# Patient Record
Sex: Female | Born: 1955
Health system: Southern US, Community
[De-identification: ages and names within clinical notes are randomized; demographics above are authoritative.]

## PROBLEM LIST (undated history)

## (undated) DIAGNOSIS — I10 Essential (primary) hypertension: Secondary | ICD-10-CM

## (undated) DIAGNOSIS — J45909 Unspecified asthma, uncomplicated: Secondary | ICD-10-CM

## (undated) DIAGNOSIS — E7421 Galactosemia: Secondary | ICD-10-CM

## (undated) HISTORY — DX: Galactosemia: E74.21

## (undated) HISTORY — PX: TONSILLECTOMY AND ADENOIDECTOMY: SUR1326

## (undated) HISTORY — DX: Unspecified asthma, uncomplicated: J45.909

## (undated) HISTORY — DX: Essential (primary) hypertension: I10

---

## 1998-07-03 ENCOUNTER — Other Ambulatory Visit: Admission: RE | Admit: 1998-07-03 | Discharge: 1998-07-03 | Payer: Self-pay | Admitting: Obstetrics and Gynecology

## 1998-08-03 ENCOUNTER — Ambulatory Visit (HOSPITAL_COMMUNITY): Admission: RE | Admit: 1998-08-03 | Discharge: 1998-08-03 | Payer: Self-pay | Admitting: Obstetrics and Gynecology

## 1998-08-15 ENCOUNTER — Encounter: Payer: Self-pay | Admitting: Obstetrics and Gynecology

## 1998-08-15 ENCOUNTER — Ambulatory Visit (HOSPITAL_COMMUNITY): Admission: RE | Admit: 1998-08-15 | Discharge: 1998-08-15 | Payer: Self-pay | Admitting: Obstetrics and Gynecology

## 1998-08-31 ENCOUNTER — Ambulatory Visit (HOSPITAL_COMMUNITY): Admission: RE | Admit: 1998-08-31 | Discharge: 1998-08-31 | Payer: Self-pay | Admitting: Obstetrics and Gynecology

## 1998-08-31 ENCOUNTER — Encounter: Payer: Self-pay | Admitting: Obstetrics and Gynecology

## 1999-01-11 ENCOUNTER — Ambulatory Visit (HOSPITAL_COMMUNITY): Admission: RE | Admit: 1999-01-11 | Discharge: 1999-01-11 | Payer: Self-pay | Admitting: Obstetrics and Gynecology

## 1999-01-29 ENCOUNTER — Encounter (INDEPENDENT_AMBULATORY_CARE_PROVIDER_SITE_OTHER): Payer: Self-pay | Admitting: Specialist

## 1999-01-29 ENCOUNTER — Other Ambulatory Visit: Admission: RE | Admit: 1999-01-29 | Discharge: 1999-01-29 | Payer: Self-pay | Admitting: Obstetrics and Gynecology

## 1999-02-23 HISTORY — PX: TOTAL VAGINAL HYSTERECTOMY: SHX2548

## 1999-02-27 ENCOUNTER — Encounter (INDEPENDENT_AMBULATORY_CARE_PROVIDER_SITE_OTHER): Payer: Self-pay

## 1999-02-27 ENCOUNTER — Inpatient Hospital Stay (HOSPITAL_COMMUNITY): Admission: RE | Admit: 1999-02-27 | Discharge: 1999-03-01 | Payer: Self-pay | Admitting: Obstetrics and Gynecology

## 1999-09-19 ENCOUNTER — Ambulatory Visit (HOSPITAL_COMMUNITY): Admission: RE | Admit: 1999-09-19 | Discharge: 1999-09-19 | Payer: Self-pay | Admitting: Obstetrics and Gynecology

## 1999-09-19 ENCOUNTER — Encounter: Payer: Self-pay | Admitting: Obstetrics and Gynecology

## 2000-09-22 ENCOUNTER — Ambulatory Visit (HOSPITAL_COMMUNITY): Admission: RE | Admit: 2000-09-22 | Discharge: 2000-09-22 | Payer: Self-pay | Admitting: Obstetrics and Gynecology

## 2000-09-22 ENCOUNTER — Encounter: Payer: Self-pay | Admitting: Obstetrics and Gynecology

## 2001-02-22 HISTORY — PX: LAPAROSCOPIC CHOLECYSTECTOMY: SUR755

## 2001-10-15 ENCOUNTER — Encounter: Payer: Self-pay | Admitting: Obstetrics and Gynecology

## 2001-10-15 ENCOUNTER — Ambulatory Visit (HOSPITAL_COMMUNITY): Admission: RE | Admit: 2001-10-15 | Discharge: 2001-10-15 | Payer: Self-pay | Admitting: Obstetrics and Gynecology

## 2003-02-07 ENCOUNTER — Encounter: Payer: Self-pay | Admitting: Obstetrics and Gynecology

## 2003-02-07 ENCOUNTER — Ambulatory Visit (HOSPITAL_COMMUNITY): Admission: RE | Admit: 2003-02-07 | Discharge: 2003-02-07 | Payer: Self-pay | Admitting: Obstetrics and Gynecology

## 2004-04-30 ENCOUNTER — Ambulatory Visit (HOSPITAL_COMMUNITY): Admission: RE | Admit: 2004-04-30 | Discharge: 2004-04-30 | Payer: Self-pay | Admitting: Obstetrics and Gynecology

## 2005-08-12 ENCOUNTER — Ambulatory Visit (HOSPITAL_COMMUNITY): Admission: RE | Admit: 2005-08-12 | Discharge: 2005-08-12 | Payer: Self-pay | Admitting: Obstetrics and Gynecology

## 2006-09-30 ENCOUNTER — Ambulatory Visit (HOSPITAL_COMMUNITY): Admission: RE | Admit: 2006-09-30 | Discharge: 2006-09-30 | Payer: Self-pay | Admitting: Obstetrics & Gynecology

## 2007-10-02 ENCOUNTER — Ambulatory Visit (HOSPITAL_COMMUNITY): Admission: RE | Admit: 2007-10-02 | Discharge: 2007-10-02 | Payer: Self-pay | Admitting: Obstetrics & Gynecology

## 2009-12-18 ENCOUNTER — Ambulatory Visit: Payer: Self-pay | Admitting: Cardiology

## 2010-01-19 ENCOUNTER — Ambulatory Visit (HOSPITAL_COMMUNITY): Admission: RE | Admit: 2010-01-19 | Discharge: 2010-01-19 | Payer: Self-pay | Admitting: Obstetrics & Gynecology

## 2010-07-15 ENCOUNTER — Encounter: Payer: Self-pay | Admitting: Obstetrics & Gynecology

## 2011-07-15 ENCOUNTER — Other Ambulatory Visit: Payer: Self-pay | Admitting: Obstetrics & Gynecology

## 2011-07-15 DIAGNOSIS — Z1231 Encounter for screening mammogram for malignant neoplasm of breast: Secondary | ICD-10-CM

## 2011-07-16 ENCOUNTER — Ambulatory Visit (HOSPITAL_COMMUNITY)
Admission: RE | Admit: 2011-07-16 | Discharge: 2011-07-16 | Disposition: A | Discharge status: Home / Self Care | Payer: BC Managed Care – PPO | Source: Ambulatory Visit | Attending: Obstetrics & Gynecology | Admitting: Obstetrics & Gynecology

## 2011-07-16 DIAGNOSIS — Z1231 Encounter for screening mammogram for malignant neoplasm of breast: Secondary | ICD-10-CM | POA: Insufficient documentation

## 2012-09-03 ENCOUNTER — Other Ambulatory Visit: Payer: Self-pay | Admitting: Obstetrics & Gynecology

## 2012-09-03 DIAGNOSIS — Z1231 Encounter for screening mammogram for malignant neoplasm of breast: Secondary | ICD-10-CM

## 2012-09-09 ENCOUNTER — Ambulatory Visit (HOSPITAL_COMMUNITY): Payer: BC Managed Care – PPO

## 2012-09-23 ENCOUNTER — Ambulatory Visit (HOSPITAL_COMMUNITY): Payer: BC Managed Care – PPO

## 2013-10-05 ENCOUNTER — Encounter: Payer: Self-pay | Admitting: Obstetrics & Gynecology

## 2013-10-07 ENCOUNTER — Encounter: Payer: Self-pay | Admitting: Obstetrics & Gynecology

## 2013-10-07 ENCOUNTER — Ambulatory Visit (INDEPENDENT_AMBULATORY_CARE_PROVIDER_SITE_OTHER): Payer: BC Managed Care – PPO | Admitting: Obstetrics & Gynecology

## 2013-10-07 VITALS — BP 112/84 | HR 68 | Resp 20 | Ht 64.0 in | Wt 239.0 lb

## 2013-10-07 DIAGNOSIS — Z1239 Encounter for other screening for malignant neoplasm of breast: Secondary | ICD-10-CM

## 2013-10-07 DIAGNOSIS — Z0000 Encounter for general adult medical examination without abnormal findings: Secondary | ICD-10-CM

## 2013-10-07 DIAGNOSIS — N390 Urinary tract infection, site not specified: Secondary | ICD-10-CM

## 2013-10-07 DIAGNOSIS — Z01419 Encounter for gynecological examination (general) (routine) without abnormal findings: Secondary | ICD-10-CM

## 2013-10-07 LAB — POCT URINALYSIS DIPSTICK
BILIRUBIN UA: NEGATIVE
Glucose, UA: NEGATIVE
Ketones, UA: NEGATIVE
Leukocytes, UA: NEGATIVE
Nitrite, UA: POSITIVE
Protein, UA: NEGATIVE
RBC UA: NEGATIVE
UROBILINOGEN UA: NEGATIVE
pH, UA: 5

## 2013-10-07 MED ORDER — SULFAMETHOXAZOLE-TMP DS 800-160 MG PO TABS: 1.0000 | ORAL_TABLET | Freq: Two times a day (BID) | ORAL | Status: DC

## 2013-10-07 NOTE — Progress Notes (Addendum)
58 y.o. [id number] MarriedCaucasianF here for annual exam.  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  The holiday was really busy for her called "[employer]  She has been really busy and is so excited for this.  No vaginal bleeding.  Declines bone density.  Declines medication so refuses BMD.  Denies any urinary symptoms today.  Aware urine shows nitrites.    Seeing Dr. Jonelle SportsPomposini next week.  Pulmonologist pushed her out to a year at last visit.  She has done really well over the last few years.   No LMP recorded. Patient has had a hysterectomy.          Sexually active: yes  The current method of family planning is status post hysterectomy.    Exercising: yes  Walk Smoker:  no  Health Maintenance: Pap:  2000  History of abnormal Pap:  no MMG:  06/2011 BI-RADS 1: Neg Colonoscopy:  2013 - Every 5 years BMD:   None TDaP:  2010 Screening Labs: PCP, Hb today: PCP, Urine today: Nitrates +. All else Neg.   reports that she has never smoked. She has never used smokeless tobacco. She reports that she does not drink alcohol or use illicit drugs.  Past Medical History  Diagnosis Date  . Galactosemia     Carrier  . Mild asthma   . Hypertension     Past Surgical History  Procedure Laterality Date  . Tonsillectomy and adenoidectomy  Age 58  . Total vaginal hysterectomy  02/1999  . Laparoscopic cholecystectomy  02/2001  . Cesarean section  1988    Current Outpatient Prescriptions  Medication Sig Dispense Refill  . ASMANEX 30 METERED DOSES 220 MCG/INH inhaler Inhale 1 puff into the lungs daily.       Marland Kitchen. diltiazem (CARDIZEM LA) 240 MG 24 hr tablet Take 240 mg by mouth daily.      . fexofenadine (ALLEGRA) 30 MG tablet Take 30 mg by mouth daily.      Marland Kitchen. FLUOCINOLONE ACETONIDE BODY 0.01 % OIL daily.       . fluticasone (VERAMYST) 27.5 MCG/SPRAY nasal spray Place 2 sprays into the nose as needed for rhinitis.      . hydrochlorothiazide (MICROZIDE) 12.5 MG capsule Take 12.5 mg by mouth  daily.      . Ibuprofen (ADVIL) 200 MG CAPS Take by mouth as needed.      . montelukast (SINGULAIR) 10 MG tablet Take 10 mg by mouth at bedtime.      . niacin (NIASPAN) 500 MG CR tablet Take 500 mg by mouth at bedtime.      . niacin-lovastatin (ADVICOR) 500-20 MG 24 hr tablet Take 1 tablet by mouth at bedtime.      . potassium chloride SA (K-DUR,KLOR-CON) 20 MEQ tablet Take 20 mEq by mouth 2 (two) times daily.      Marland Kitchen. EPINEPHrine HCl (ASTHMANEFRIN IN) Inhale into the lungs.      Marland Kitchen. ipratropium-albuterol (DUONEB) 0.5-2.5 (3) MG/3ML SOLN Take 3 mLs by nebulization as needed.       No current facility-administered medications for this visit.    Family History  Problem Relation Age of Onset  . Diabetes Father   . Hypertension Father   . Cancer Father     Prostate  . Diabetes Mellitus II Maternal Grandmother   . Liver cancer Paternal Grandfather   . Hypertension Mother   . Heart disease Mother   . Heart disease Maternal Uncle   . Stroke Paternal Grandmother   .  Hypertension Paternal Grandmother     ROS:  Pertinent items are noted in HPI.  Otherwise, a comprehensive ROS was negative.  Exam:   BP 112/84  Pulse 68  Resp 20  Ht 5\' 4"  (1.626 m)  Wt 239 lb (108.41 kg)  BMI 41.00 kg/m2  Weight change: +17#  Height: 5\' 4"  (162.6 cm)  Ht Readings from Last 3 Encounters:  10/07/13 5\' 4"  (1.626 m)    General appearance: alert, cooperative and appears stated age Head: Normocephalic, without obvious abnormality, atraumatic Neck: no adenopathy, supple, symmetrical, trachea midline and thyroid normal to inspection and palpation Lungs: clear to auscultation bilaterally Breasts: normal appearance, no masses or tenderness Heart: regular rate and rhythm Abdomen: soft, non-tender; bowel sounds normal; no masses,  no organomegaly Extremities: extremities normal, atraumatic, no cyanosis or edema Skin: Skin color, texture, turgor normal. No rashes or lesions Lymph nodes: Cervical, supraclavicular,  and axillary nodes normal. No abnormal inguinal nodes palpated Neurologic: Grossly normal   Pelvic: External genitalia:  no lesions              Urethra:  normal appearing urethra with no masses, tenderness or lesions              Bartholins and Skenes: normal                 Vagina: normal appearing vagina with normal color and discharge, no lesions              Cervix: absent              Pap taken: no Bimanual Exam:  Uterus:  uterus absent              Adnexa: no mass, fullness, tenderness               Rectovaginal: Confirms               Anus:  normal sphincter tone, no lesions  A:  Well Woman with normal exam PMP, no HRT Entereocele Family hx of colon cancer.  Has colonoscopy every 5 years. Asthma Nitrate + urine  P:   Mammogram overdue.  Pt aware.   Will scheduled.  Order placed. pap smear not indicated. Bactrim ds bid x 5 days to use if symptoms occur. Urine culture pending. Labs with PCP. return annually or prn  An After Visit Summary was printed and given to the patient.

## 2013-10-07 NOTE — Patient Instructions (Signed)

## 2013-10-09 LAB — URINE CULTURE
COLONY COUNT: NO GROWTH
ORGANISM ID, BACTERIA: NO GROWTH

## 2014-04-25 ENCOUNTER — Encounter: Payer: Self-pay | Admitting: Obstetrics & Gynecology

## 2014-10-21 ENCOUNTER — Ambulatory Visit: Payer: BC Managed Care – PPO | Admitting: Obstetrics & Gynecology

## 2014-12-20 ENCOUNTER — Encounter: Payer: Self-pay | Admitting: Obstetrics & Gynecology

## 2014-12-20 ENCOUNTER — Ambulatory Visit (INDEPENDENT_AMBULATORY_CARE_PROVIDER_SITE_OTHER): Payer: BLUE CROSS/BLUE SHIELD | Admitting: Obstetrics & Gynecology

## 2014-12-20 VITALS — BP 138/94 | HR 80 | Resp 18 | Ht 63.75 in | Wt 231.0 lb

## 2014-12-20 DIAGNOSIS — Z01419 Encounter for gynecological examination (general) (routine) without abnormal findings: Secondary | ICD-10-CM

## 2014-12-20 DIAGNOSIS — K469 Unspecified abdominal hernia without obstruction or gangrene: Secondary | ICD-10-CM | POA: Diagnosis not present

## 2014-12-20 NOTE — Progress Notes (Signed)
59 y.o. [id number] MarriedCaucasianF here for annual exam.  Doing well.  Denis vaginal bleeding.  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  Will be totally in the black this year.    PCP:  Dr. Jonelle SportsPomposini in [place].  Last visit was in March/April.  Goes twice yearly.  Labs were good per pt.    No LMP recorded. Patient has had a hysterectomy.          Sexually active: Yes.    The current method of family planning is status post hysterectomy.    Exercising: Yes.    Walking every day Smoker:  no  Health Maintenance: Pap:  2000 - Normal  History of abnormal Pap:  no MMG:  07/17/11 BIRADS1:neg Colonoscopy:  2013 - every 5 years BMD:   None TDaP:  2010 Screening Labs: PCP, Hb today: PCP, Urine today: PCP   reports that she has never smoked. She has never used smokeless tobacco. She reports that she does not drink alcohol or use illicit drugs.  Past Medical History  Diagnosis Date  . Galactosemia     Carrier  . Mild asthma   . Hypertension     Past Surgical History  Procedure Laterality Date  . Tonsillectomy and adenoidectomy  Age 59  . Total vaginal hysterectomy  02/1999    A+P repair, cystoscopy  . Laparoscopic cholecystectomy  02/2001  . Cesarean section  1988    Current Outpatient Prescriptions  Medication Sig Dispense Refill  . ASMANEX 30 METERED DOSES 220 MCG/INH inhaler Inhale 1 puff into the lungs daily.     . Cholecalciferol (VITAMIN D) 2000 UNITS tablet Take 2,000 Units by mouth daily.    Marland Kitchen. diltiazem (CARDIZEM LA) 240 MG 24 hr tablet Take 240 mg by mouth daily.    . fexofenadine (ALLEGRA) 30 MG tablet Take 30 mg by mouth daily.    . montelukast (SINGULAIR) 10 MG tablet Take 10 mg by mouth at bedtime.    . niacin-lovastatin (ADVICOR) 500-20 MG 24 hr tablet Take 1 tablet by mouth at bedtime.    Marland Kitchen. EPINEPHrine HCl (ASTHMANEFRIN IN) Inhale into the lungs.     No current facility-administered medications for this  visit.    Family History  Problem Relation Age of Onset  . Diabetes Father   . Hypertension Father   . Cancer Father     Prostate  . Diabetes Mellitus II Maternal Grandmother   . Liver cancer Paternal Grandfather   . Hypertension Mother   . Heart disease Mother   . Heart disease Maternal Uncle   . Stroke Paternal Grandmother   . Hypertension Paternal Grandmother     ROS:  Pertinent items are noted in HPI.  Otherwise, a comprehensive ROS was negative.  Exam:   BP 138/94 mmHg  Pulse 80  Resp 18  Ht 5' 3.75" (1.619 m)  Wt 231 lb (104.781 kg)  BMI 39.98 kg/m2  Weight change: -8 pounds  Height: 5' 3.75" (161.9 cm)  Ht Readings from Last 3 Encounters:  12/20/14 5' 3.75" (1.619 m)  10/07/13 5\' 4"  (1.626 m)    General appearance: alert, cooperative and appears stated age Head: Normocephalic, without obvious abnormality, atraumatic Neck: no adenopathy, supple, symmetrical, trachea midline and thyroid normal to inspection and palpation Lungs: clear to auscultation bilaterally Breasts: normal appearance, no masses or tenderness Heart: regular rate and rhythm Abdomen: soft, non-tender; bowel sounds normal; no masses,  no organomegaly Extremities:  extremities normal, atraumatic, no cyanosis or edema Skin: Skin color, texture, turgor normal. No rashes or lesions Lymph nodes: Cervical, supraclavicular, and axillary nodes normal. No abnormal inguinal nodes palpated Neurologic: Grossly normal   Pelvic: External genitalia:  no lesions              Urethra:  normal appearing urethra with no masses, tenderness or lesions              Bartholins and Skenes: normal                 Vagina: normal appearing vagina with normal color and discharge, no lesions              Cervix: absent              Pap taken: No. Bimanual Exam:  Uterus:  uterus absent              Adnexa: no mass, fullness, tenderness               Rectovaginal: Confirms               Anus:  normal sphincter tone, no  lesions  Chaperone was present for exam.  A:  Well Woman with normal exam PMP, no HRT Entereocele Family hx of colon cancer. Has colonoscopy every 5 years.  Next due 2018. Asthma  P: Mammogram overdue. Pt aware. Scheduled for her today while she was in the office.  Scheduled for July 1st at 4pm for her.   pap smear not indicated. Labs with PCP return annually or prn

## 2014-12-23 ENCOUNTER — Ambulatory Visit
Admission: RE | Admit: 2014-12-23 | Discharge: 2014-12-23 | Disposition: A | Discharge status: Home / Self Care | Payer: BLUE CROSS/BLUE SHIELD | Source: Ambulatory Visit | Attending: Obstetrics & Gynecology | Admitting: Obstetrics & Gynecology

## 2014-12-23 DIAGNOSIS — Z1239 Encounter for other screening for malignant neoplasm of breast: Secondary | ICD-10-CM

## 2014-12-28 ENCOUNTER — Ambulatory Visit: Payer: Self-pay | Admitting: Obstetrics & Gynecology

## 2015-12-01 ENCOUNTER — Other Ambulatory Visit: Payer: Self-pay | Admitting: Orthopedic Surgery

## 2015-12-01 DIAGNOSIS — R52 Pain, unspecified: Secondary | ICD-10-CM

## 2015-12-01 DIAGNOSIS — R609 Edema, unspecified: Secondary | ICD-10-CM

## 2015-12-11 ENCOUNTER — Other Ambulatory Visit: Payer: Self-pay | Admitting: Orthopedic Surgery

## 2015-12-11 ENCOUNTER — Ambulatory Visit
Admission: RE | Admit: 2015-12-11 | Discharge: 2015-12-11 | Disposition: A | Discharge status: Home / Self Care | Payer: BLUE CROSS/BLUE SHIELD | Source: Ambulatory Visit | Attending: Orthopedic Surgery | Admitting: Orthopedic Surgery

## 2015-12-11 DIAGNOSIS — Z77018 Contact with and (suspected) exposure to other hazardous metals: Secondary | ICD-10-CM

## 2015-12-11 DIAGNOSIS — R609 Edema, unspecified: Secondary | ICD-10-CM

## 2015-12-11 DIAGNOSIS — R52 Pain, unspecified: Secondary | ICD-10-CM

## 2015-12-14 DIAGNOSIS — M25562 Pain in left knee: Secondary | ICD-10-CM

## 2015-12-14 DIAGNOSIS — G8929 Other chronic pain: Secondary | ICD-10-CM | POA: Insufficient documentation

## 2016-01-03 HISTORY — PX: KNEE ARTHROSCOPY: SUR90

## 2016-01-09 DIAGNOSIS — Z9889 Other specified postprocedural states: Secondary | ICD-10-CM | POA: Insufficient documentation

## 2016-02-05 ENCOUNTER — Other Ambulatory Visit: Payer: Self-pay | Admitting: Obstetrics & Gynecology

## 2016-02-05 DIAGNOSIS — Z1231 Encounter for screening mammogram for malignant neoplasm of breast: Secondary | ICD-10-CM

## 2016-02-13 ENCOUNTER — Ambulatory Visit
Admission: RE | Admit: 2016-02-13 | Discharge: 2016-02-13 | Disposition: A | Discharge status: Home / Self Care | Payer: BLUE CROSS/BLUE SHIELD | Source: Ambulatory Visit | Attending: Obstetrics & Gynecology | Admitting: Obstetrics & Gynecology

## 2016-02-13 DIAGNOSIS — Z1231 Encounter for screening mammogram for malignant neoplasm of breast: Secondary | ICD-10-CM

## 2016-03-19 ENCOUNTER — Encounter: Payer: Self-pay | Admitting: Obstetrics & Gynecology

## 2016-03-19 ENCOUNTER — Ambulatory Visit (INDEPENDENT_AMBULATORY_CARE_PROVIDER_SITE_OTHER): Payer: BLUE CROSS/BLUE SHIELD | Admitting: Obstetrics & Gynecology

## 2016-03-19 VITALS — BP 132/88 | HR 82 | Resp 14 | Ht 64.0 in | Wt 216.6 lb

## 2016-03-19 DIAGNOSIS — K469 Unspecified abdominal hernia without obstruction or gangrene: Secondary | ICD-10-CM

## 2016-03-19 DIAGNOSIS — Z0000 Encounter for general adult medical examination without abnormal findings: Secondary | ICD-10-CM

## 2016-03-19 DIAGNOSIS — R8290 Unspecified abnormal findings in urine: Secondary | ICD-10-CM

## 2016-03-19 DIAGNOSIS — Z01419 Encounter for gynecological examination (general) (routine) without abnormal findings: Secondary | ICD-10-CM

## 2016-03-19 DIAGNOSIS — N3090 Cystitis, unspecified without hematuria: Secondary | ICD-10-CM

## 2016-03-19 LAB — POCT URINALYSIS DIPSTICK
BILIRUBIN UA: NEGATIVE
GLUCOSE UA: NEGATIVE
Ketones, UA: NEGATIVE
LEUKOCYTES UA: NEGATIVE
NITRITE UA: POSITIVE
Protein, UA: NEGATIVE
RBC UA: NEGATIVE
UROBILINOGEN UA: NEGATIVE
pH, UA: 5

## 2016-03-19 NOTE — Progress Notes (Addendum)
60 y.o. [id number] MarriedCaucasianF here for annual exam.  No vaginal bleeding.    PCP:  Dr. Jonelle Sports.  She is going to see [facility] for new pt appt in October.  Transferring care to [facility].  Lives in [place], [place].  No LMP recorded. Patient has had a hysterectomy.          Sexually active: Yes.    The current method of family planning is status post hysterectomy.    Exercising: Yes.    walking Smoker:  no  Health Maintenance: Pap:  2000 normal  History of abnormal Pap:  no MMG:  02/13/16 BIRADS 1 negative  Colonoscopy:  2013 repeat 5 years  BMD:   Never, not sure she would not have any findings treated TDaP:  2010  Pneumonia vaccine(s):  2012  Zostavax:   never Hep C testing: will do with PCP Screening Labs: PCP, Hb today: PCP, Urine today: positive nitrites   reports that she has never smoked. She has never used smokeless tobacco. She reports that she does not drink alcohol or use drugs.  Past Medical History:  Diagnosis Date  . Galactosemia    Carrier  . Hypertension   . Mild asthma     Past Surgical History:  Procedure Laterality Date  . CESAREAN SECTION  1988  . LAPAROSCOPIC CHOLECYSTECTOMY  02/2001  . TONSILLECTOMY AND ADENOIDECTOMY  Age 51  . TOTAL VAGINAL HYSTERECTOMY  02/1999   A+P repair, cystoscopy    Current Outpatient Prescriptions  Medication Sig Dispense Refill  . ASMANEX 30 METERED DOSES 220 MCG/INH inhaler Inhale 1 puff into the lungs daily.     . Cholecalciferol (VITAMIN D) 2000 UNITS tablet Take 2,000 Units by mouth daily.    Marland Kitchen diltiazem (CARDIZEM LA) 240 MG 24 hr tablet Take 240 mg by mouth daily.    Marland Kitchen EPINEPHrine HCl (ASTHMANEFRIN IN) Inhale into the lungs.    . fexofenadine (ALLEGRA) 30 MG tablet Take 30 mg by mouth daily.    . montelukast (SINGULAIR) 10 MG tablet Take 10 mg by mouth at bedtime.    . niacin-lovastatin (ADVICOR) 500-20 MG 24 hr tablet Take 1 tablet by mouth at bedtime.     No current facility-administered medications for  this visit.     Family History  Problem Relation Age of Onset  . Diabetes Father   . Hypertension Father   . Cancer Father     Prostate  . Diabetes Mellitus II Maternal Grandmother   . Liver cancer Paternal Grandfather   . Hypertension Mother   . Heart disease Mother   . Heart disease Maternal Uncle   . Stroke Paternal Grandmother   . Hypertension Paternal Grandmother     ROS:  Pertinent items are noted in HPI.  Otherwise, a comprehensive ROS was negative.  Exam:   Vitals:   03/19/16 1340  BP: 132/88  Pulse: 82  Resp: 14    General appearance: alert, cooperative and appears stated age Head: Normocephalic, without obvious abnormality, atraumatic Neck: no adenopathy, supple, symmetrical, trachea midline and thyroid normal to inspection and palpation Lungs: clear to auscultation bilaterally Breasts: normal appearance, no masses or tenderness Heart: regular rate and rhythm Abdomen: soft, non-tender; bowel sounds normal; no masses,  no organomegaly Extremities: extremities normal, atraumatic, no cyanosis or edema Skin: Skin color, texture, turgor normal. No rashes or lesions Lymph nodes: Cervical, supraclavicular, and axillary nodes normal. No abnormal inguinal nodes palpated Neurologic: Grossly normal   Pelvic: External genitalia:  no lesions  Urethra:  normal appearing urethra with no masses, tenderness or lesions              Bartholins and Skenes: normal                 Vagina: normal appearing vagina with normal color and discharge, no lesions              Cervix: absent              Pap taken: No. Bimanual Exam:  Uterus:  uterus absent              Adnexa: no masses, firmness               Rectovaginal: Confirms               Anus:  normal sphincter tone, no lesions  Chaperone was present for exam.  A:     Well Woman with normal exam PMP, no HRT Entereocele, stable Family hx of colon cancer. Has colonoscopy every 5 years.  Next due  2018. Asthma Hypertension Abnormal urine test today  P: Mammogram yearly. pap smear not indicated Labs with PCP.  D/w pt Hep C testing.  She will do this next week.   Bactrim DS bid x 3 day rx given Rx for shingles vaccine given to pt.  Hx of chicken pox as a child. return annually or prn

## 2016-03-19 NOTE — Addendum Note (Signed)
Addended by: Jerene BearsMILLER, Evey S on: 03/19/2016 03:22 PM   Modules accepted: Orders

## 2016-03-21 LAB — URINE CULTURE

## 2016-03-27 NOTE — Addendum Note (Signed)
Addended by: Jerene BearsMILLER, Larna S on: 03/27/2016 05:20 AM   Modules accepted: Orders

## 2016-04-04 ENCOUNTER — Telehealth: Payer: Self-pay | Admitting: Obstetrics & Gynecology

## 2016-04-04 NOTE — Telephone Encounter (Signed)
Patient called in and thinks she needs another urinalysis.  She wants to speak with a nurse to verify

## 2016-04-04 NOTE — Telephone Encounter (Addendum)
Patient  Canceled her appointment for repeat uc. unable to come in will call back to  reschedule

## 2016-04-04 NOTE — Telephone Encounter (Signed)
Returned call to patient. Patient states that when she was here for her AEX she had an abnormal UA. Dr. Hyacinth MeekerMiller had her take bactrim and call our office when she would be in [place] again to come in for repeat UC. Patient states she will be in town on 04/09/16. Nurse visit for repeat UC scheduled for 04/09/16 at 1600. Patient agreeable to date and time.   Routing to provider for final review. Patient agreeable to disposition. Will close encounter.

## 2016-04-09 ENCOUNTER — Ambulatory Visit: Payer: Self-pay

## 2016-11-06 ENCOUNTER — Ambulatory Visit (INDEPENDENT_AMBULATORY_CARE_PROVIDER_SITE_OTHER): Payer: BLUE CROSS/BLUE SHIELD | Admitting: Orthopaedic Surgery

## 2016-11-06 ENCOUNTER — Ambulatory Visit (INDEPENDENT_AMBULATORY_CARE_PROVIDER_SITE_OTHER): Payer: Self-pay

## 2016-11-06 DIAGNOSIS — M25562 Pain in left knee: Secondary | ICD-10-CM | POA: Diagnosis not present

## 2016-11-06 DIAGNOSIS — G8929 Other chronic pain: Secondary | ICD-10-CM | POA: Diagnosis not present

## 2016-11-06 NOTE — Progress Notes (Signed)
Office Visit Note   Patient: Brandi Harrell           Date of Birth: 01/24/1956           MRN: [id number] Visit Date: 11/06/2016              Requested by: Alysia PennaHolwerda, Scott, MD [address] [place], [place] [id number] PCP: Alysia PennaHolwerda, Scott, MD   Assessment & Plan: Visit Diagnoses:  1. Chronic pain of left knee     Plan: We had a long and thorough discussion about her knee. I do feel that she'll benefit from isolated quad strengthening exercises as well as trying something like glucosamine orTumeric. Certainly she would potentially benefit from a steroid injection and hyaluronic acid. I gave her handouts about hyaluronic acid as well. We had a good discussion about her knee in general. She would like to follow up as needed but said that her next step would be considering injections if she continues to have problems. All questions were encouraged and answered.   Follow-Up Instructions: Return if symptoms worsen or fail to improve.   Orders:  Orders Placed This Encounter  Procedures  . XR KNEE 3 VIEW LEFT   No orders of the defined types were placed in this encounter.     Procedures: No procedures performed   Clinical Data: No additional findings.   Subjective: Chief Complaint  Patient presents with  . Left Knee - Pain  Patient is very pleasant 61 year old who comes in the second opinion for her left knee. She had arthroscopic intervention of this left knee in July 2017 by another surgeon here in town. She has not seen the surgeon since then but only saw his physician assistant. She was told that she had some more cartilage in her knee and it sounds like they did some type of chondroplasty and a partial meniscectomy. She still gets some knee pain since then and was concerned about the pain she still gets her knee at times when she is walking and feels like the knee gets a sharp stabbing pain. Her almost as if it's going to give way. She came to us for a second opinion in terms of  just assessing the evaluating it. She has not had any type of intervention since surgery. She's been to physical therapy. She is not had any type of injections.  HPI  Review of Systems She denies any headache, chest pain, shortness of breath, fever, chills, nausea, vomiting.  Objective: Vital Signs: There were no vitals taken for this visit.  Physical Exam She is alert and oriented 3 and in no acute distress Ortho Exam Her left knee hyperextends as does her right knee. There is no effusion of either knee. She has some global tenderness. There is no significant patellofemoral crepitation. The knee feels ligamentously stable with excellent range of motion. Specialty Comments:  No specialty comments available.  Imaging: Xr Knee 3 View Left  Result Date: 11/06/2016 AP lateral and sunrise views of left knee show only mild arthritic changes with no acute injuries. There is no effusion. There is no significant malalignment.    PMFS History: Patient Active Problem List   Diagnosis Date Noted  . Enterocele 12/20/2014   Past Medical History:  Diagnosis Date  . Galactosemia (HCC)    Carrier  . Hypertension   . Mild asthma     Family History  Problem Relation Age of Onset  . Diabetes Father   . Hypertension Father   . Cancer  Father        Prostate  . Diabetes Mellitus II Maternal Grandmother   . Liver cancer Paternal Grandfather   . Hypertension Mother   . Heart disease Mother   . Heart disease Maternal Uncle   . Stroke Paternal Grandmother   . Hypertension Paternal Grandmother     Past Surgical History:  Procedure Laterality Date  . CESAREAN SECTION  1988  . KNEE ARTHROSCOPY Left 01/03/2016   done by Dr. Sherlean Foot   . LAPAROSCOPIC CHOLECYSTECTOMY  02/2001  . TONSILLECTOMY AND ADENOIDECTOMY  Age 4  . TOTAL VAGINAL HYSTERECTOMY  02/1999   A+P repair, cystoscopy   Social History   Occupational History  . Not on file.   Social History Main Topics  . Smoking status:  Never Smoker  . Smokeless tobacco: Never Used  . Alcohol use No  . Drug use: No  . Sexual activity: Yes    Birth control/ protection: Surgical

## 2017-02-11 ENCOUNTER — Other Ambulatory Visit: Payer: Self-pay | Admitting: Obstetrics & Gynecology

## 2017-02-11 DIAGNOSIS — Z1231 Encounter for screening mammogram for malignant neoplasm of breast: Secondary | ICD-10-CM

## 2017-02-21 ENCOUNTER — Ambulatory Visit
Admission: RE | Admit: 2017-02-21 | Discharge: 2017-02-21 | Disposition: A | Discharge status: Home / Self Care | Payer: BLUE CROSS/BLUE SHIELD | Source: Ambulatory Visit | Attending: Obstetrics & Gynecology | Admitting: Obstetrics & Gynecology

## 2017-02-21 DIAGNOSIS — Z1231 Encounter for screening mammogram for malignant neoplasm of breast: Secondary | ICD-10-CM

## 2017-09-11 NOTE — Progress Notes (Signed)
62 y.o. [id number] MarriedCaucasianF here for annual exam.  Doing well.  BP is under much better control.  No vaginal bleeding.    Still having some left knee pain.  Saw Dr. Magnus Ivan and he thinks she is hyperextending her knee.  She's worked on the actual way she walks and she is better.      PCP:  Dr. Link Snuffer.  Blood work was good last year.  Has appt next month.  No LMP recorded. Patient has had a hysterectomy.          Sexually active: Yes.    The current method of family planning is status post hysterectomy.    Exercising: Yes.    walking Smoker:  no  Health Maintenance: Pap:  2000 neg History of abnormal Pap:  no MMG:  02-21-17 category b density birads 1:neg Colonoscopy:  2018 f/u 59yrs BMD:   None.  Isn't going to take any medication so declines. TDaP:  2010 Pneumonia vaccine(s):  2012 Shingrix:   Not done Hep C testing: will do with Dr. Link Snuffer Screening Labs: planning next month   reports that she has never smoked. She has never used smokeless tobacco. She reports that she does not drink alcohol or use drugs.  Past Medical History:  Diagnosis Date  . Galactosemia (HCC)    Carrier  . Hypertension   . Mild asthma     Past Surgical History:  Procedure Laterality Date  . CESAREAN SECTION  1988  . KNEE ARTHROSCOPY Left 01/03/2016   done by Dr. Sherlean Foot   . LAPAROSCOPIC CHOLECYSTECTOMY  02/2001  . TONSILLECTOMY AND ADENOIDECTOMY  Age 49  . TOTAL VAGINAL HYSTERECTOMY  02/1999   A+P repair, cystoscopy    Current Outpatient Medications  Medication Sig Dispense Refill  . ASMANEX 30 METERED DOSES 220 MCG/INH inhaler Inhale 1 puff into the lungs daily.     . chlorthalidone (HYGROTON) 25 MG tablet   5  . Cholecalciferol (VITAMIN D) 2000 UNITS tablet Take 2,000 Units by mouth daily.    Marland Kitchen diltiazem (CARDIZEM LA) 240 MG 24 hr tablet Take 240 mg by mouth daily.    Marland Kitchen EPINEPHrine HCl (ASTHMANEFRIN IN) Inhale into the lungs.    . fexofenadine (ALLEGRA) 30 MG tablet Take 30 mg by  mouth daily.    Marland Kitchen losartan (COZAAR) 100 MG tablet Take 100 mg by mouth.    . montelukast (SINGULAIR) 10 MG tablet Take 10 mg by mouth at bedtime.    . Naproxen Sodium (ALEVE PO) Take by mouth.    . potassium chloride SA (K-DUR,KLOR-CON) 20 MEQ tablet   5   No current facility-administered medications for this visit.     Family History  Problem Relation Age of Onset  . Diabetes Father   . Hypertension Father   . Cancer Father        Prostate  . Diabetes Mellitus II Maternal Grandmother   . Liver cancer Paternal Grandfather   . Hypertension Mother   . Heart disease Mother   . Heart disease Maternal Uncle   . Stroke Paternal Grandmother   . Hypertension Paternal Grandmother     Review of Systems  All other systems reviewed and are negative.   Exam:   There were no vitals taken for this visit.  Height:      Ht Readings from Last 3 Encounters:  03/19/16 5\' 4"  (1.626 m)  12/20/14 5' 3.75" (1.619 m)  10/07/13 5\' 4"  (1.626 m)    General appearance: alert, cooperative and  appears stated age Head: Normocephalic, without obvious abnormality, atraumatic Neck: no adenopathy, supple, symmetrical, trachea midline and thyroid normal to inspection and palpation Lungs: clear to auscultation bilaterally Breasts: normal appearance, no masses or tenderness Heart: regular rate and rhythm Abdomen: soft, non-tender; bowel sounds normal; no masses,  no organomegaly Extremities: extremities normal, atraumatic, no cyanosis or edema Skin: Skin color, texture, turgor normal. No rashes or lesions Lymph nodes: Cervical, supraclavicular, and axillary nodes normal. No abnormal inguinal nodes palpated Neurologic: Grossly normal   Pelvic: External genitalia:  no lesions              Urethra:  normal appearing urethra with no masses, tenderness or lesions              Bartholins and Skenes: normal                 Vagina: normal appearing vagina with normal color and discharge, no lesions               Cervix: absent              Pap taken: No. Bimanual Exam:  Uterus:  uterus absent              Adnexa: no mass, fullness, tenderness               Rectovaginal: Confirms               Anus:  normal sphincter tone, no lesions  Chaperone was present for exam.  A:  Well Woman with normal exam PMP, no HRT Enterocele, stable Family hx of colon cancer.  Having every 5 year colonoscopy testing.   Asthma, mild Hypertension  P:   Mammogram yearly.  Have discussed 3D with pt. pap smear not indicated Declines doing BMD Declines shingrix vaccination return annually or prn

## 2017-09-12 ENCOUNTER — Ambulatory Visit (INDEPENDENT_AMBULATORY_CARE_PROVIDER_SITE_OTHER): Payer: BLUE CROSS/BLUE SHIELD | Admitting: Obstetrics & Gynecology

## 2017-09-12 ENCOUNTER — Other Ambulatory Visit: Payer: Self-pay

## 2017-09-12 ENCOUNTER — Encounter: Payer: Self-pay | Admitting: Obstetrics & Gynecology

## 2017-09-12 VITALS — BP 122/80 | HR 70 | Resp 16 | Ht 63.75 in | Wt 213.0 lb

## 2017-09-12 DIAGNOSIS — J4520 Mild intermittent asthma, uncomplicated: Secondary | ICD-10-CM | POA: Diagnosis not present

## 2017-09-12 DIAGNOSIS — Z01419 Encounter for gynecological examination (general) (routine) without abnormal findings: Secondary | ICD-10-CM

## 2017-09-12 DIAGNOSIS — J45909 Unspecified asthma, uncomplicated: Secondary | ICD-10-CM | POA: Insufficient documentation

## 2018-04-02 ENCOUNTER — Other Ambulatory Visit: Payer: Self-pay | Admitting: Obstetrics & Gynecology

## 2018-04-02 DIAGNOSIS — Z1231 Encounter for screening mammogram for malignant neoplasm of breast: Secondary | ICD-10-CM

## 2018-05-07 ENCOUNTER — Ambulatory Visit
Admission: RE | Admit: 2018-05-07 | Discharge: 2018-05-07 | Disposition: A | Discharge status: Home / Self Care | Payer: BLUE CROSS/BLUE SHIELD | Source: Ambulatory Visit | Attending: Obstetrics & Gynecology | Admitting: Obstetrics & Gynecology

## 2018-05-07 DIAGNOSIS — Z1231 Encounter for screening mammogram for malignant neoplasm of breast: Secondary | ICD-10-CM

## 2018-11-27 ENCOUNTER — Ambulatory Visit (INDEPENDENT_AMBULATORY_CARE_PROVIDER_SITE_OTHER): Payer: BC Managed Care – PPO | Admitting: Obstetrics & Gynecology

## 2018-11-27 ENCOUNTER — Encounter: Payer: Self-pay | Admitting: Obstetrics & Gynecology

## 2018-11-27 ENCOUNTER — Other Ambulatory Visit: Payer: Self-pay

## 2018-11-27 VITALS — BP 110/70 | HR 76 | Temp 97.3°F | Ht 63.75 in | Wt 214.0 lb

## 2018-11-27 DIAGNOSIS — Z23 Encounter for immunization: Secondary | ICD-10-CM

## 2018-11-27 DIAGNOSIS — Z01419 Encounter for gynecological examination (general) (routine) without abnormal findings: Secondary | ICD-10-CM | POA: Diagnosis not present

## 2018-11-27 MED ORDER — NYSTATIN 100000 UNIT/GM EX CREA: 1.0000 "application " | TOPICAL_CREAM | Freq: Two times a day (BID) | CUTANEOUS | 0 refills | Status: AC

## 2018-11-27 NOTE — Progress Notes (Signed)
63 y.o. G85P2002 Married White or Caucasian female here for annual exam.  Doing well.  Denies vaginal bleeding.    PCP:  Dr. Sharon Mt.  Last appt was April 2020.  Next appt is March 05, 2019.  Will have blood work before this appt in September.  No LMP recorded. Patient has had a hysterectomy.          Sexually active: Yes.    The current method of family planning is status post hysterectomy.    Exercising: Yes.    walking Smoker:  no  Health Maintenance: Pap: 2000 Neg  History of abnormal Pap:  no MMG:  05/07/18 BIRADS1:neg  Colonoscopy:  2018 f/u 5 years  BMD:   Never.  Declines doing this and wouldn't take medications.   TDaP:  2010 Pneumonia vaccine(s):  2012 Shingrix:   No.  She has decided to wait on getting it right now.   Hep C testing: No Screening Labs: PCP   reports that she has never smoked. She has never used smokeless tobacco. She reports that she does not drink alcohol or use drugs.  Past Medical History:  Diagnosis Date  . Galactosemia (HCC)    Carrier  . Hypertension   . Mild asthma     Past Surgical History:  Procedure Laterality Date  . CESAREAN SECTION  1988  . KNEE ARTHROSCOPY Left 01/03/2016   done by Dr. Sherlean Foot   . LAPAROSCOPIC CHOLECYSTECTOMY  02/2001  . TONSILLECTOMY AND ADENOIDECTOMY  Age 37  . TOTAL VAGINAL HYSTERECTOMY  02/1999   A+P repair, cystoscopy    Current Outpatient Medications  Medication Sig Dispense Refill  . ASMANEX 30 METERED DOSES 220 MCG/INH inhaler Inhale 1 puff into the lungs daily.     . chlorthalidone (HYGROTON) 25 MG tablet   5  . Cholecalciferol (VITAMIN D) 2000 UNITS tablet Take 2,000 Units by mouth daily.    Marland Kitchen diltiazem (CARDIZEM LA) 240 MG 24 hr tablet Take 240 mg by mouth daily.    . fexofenadine (ALLEGRA) 30 MG tablet Take 30 mg by mouth daily.    Marland Kitchen losartan (COZAAR) 100 MG tablet Take 100 mg by mouth.    . montelukast (SINGULAIR) 10 MG tablet Take 10 mg by mouth at bedtime.    . Naproxen Sodium (ALEVE PO) Take  by mouth.    . potassium chloride SA (K-DUR,KLOR-CON) 20 MEQ tablet   5  . Triamcinolone Acetonide (NASACORT AQ NA) Place into the nose.    Marland Kitchen EPINEPHrine HCl (ASTHMANEFRIN IN) Inhale into the lungs.     No current facility-administered medications for this visit.     Family History  Problem Relation Age of Onset  . Diabetes Father   . Hypertension Father   . Colon cancer Father   . Diabetes Mellitus II Maternal Grandmother   . Liver cancer Paternal Grandfather   . Hypertension Mother   . Heart disease Mother   . Heart disease Maternal Uncle   . Stroke Paternal Grandmother   . Hypertension Paternal Grandmother     Review of Systems  All other systems reviewed and are negative.   Exam:   BP 110/70   Pulse 76   Temp (!) 97.3 F (36.3 C) (Temporal)   Ht 5' 3.75" (1.619 m)   Wt 214 lb (97.1 kg)   BMI 37.02 kg/m   Height: 5' 3.75" (161.9 cm)  Ht Readings from Last 3 Encounters:  11/27/18 5' 3.75" (1.619 m)  09/12/17 5' 3.75" (1.619 m)  03/19/16  5\' 4"  (1.626 m)    General appearance: alert, cooperative and appears stated age Head: Normocephalic, without obvious abnormality, atraumatic Neck: no adenopathy, supple, symmetrical, trachea midline and thyroid normal to inspection and palpation Lungs: clear to auscultation bilaterally Breasts: normal appearance, no masses or tenderness Heart: regular rate and rhythm Abdomen: soft, non-tender; bowel sounds normal; no masses,  no organomegaly Extremities: extremities normal, atraumatic, no cyanosis or edema Skin: Skin color, texture, turgor normal. No rashes or lesions Lymph nodes: Cervical, supraclavicular, and axillary nodes normal. No abnormal inguinal nodes palpated Neurologic: Grossly normal   Pelvic: External genitalia:  no lesions              Urethra:  normal appearing urethra with no masses, tenderness or lesions              Bartholins and Skenes: normal                 Vagina: normal appearing vagina with normal  color and discharge, no lesions              Cervix: absent              Pap taken: No. Bimanual Exam:  Uterus:  uterus absent              Adnexa: no mass, fullness, tenderness               Rectovaginal: Confirms               Anus:  normal sphincter tone, no lesions  Chaperone was present for exam.  A:  Well Woman with normal exam H/o TVH, A&P repair 2000 PMP, no HRT Asthma Hypertension Family hx of colon cancer.  Doing colonoscopy every 5 years  P:   Mammogram yearly.  Doing 3D MMG. pap smear not indicated Declines BMD Will update Tdap Colonoscopy is UTD Nystatin cream bid prn itching.  30g/1RF Return annually or prn

## 2019-03-30 ENCOUNTER — Other Ambulatory Visit: Payer: Self-pay | Admitting: Obstetrics & Gynecology

## 2019-03-30 DIAGNOSIS — Z1231 Encounter for screening mammogram for malignant neoplasm of breast: Secondary | ICD-10-CM

## 2019-05-14 ENCOUNTER — Other Ambulatory Visit: Payer: Self-pay

## 2019-05-14 ENCOUNTER — Ambulatory Visit
Admission: RE | Admit: 2019-05-14 | Discharge: 2019-05-14 | Disposition: A | Discharge status: Home / Self Care | Payer: Federal, State, Local not specified - PPO | Source: Ambulatory Visit | Attending: Obstetrics & Gynecology | Admitting: Obstetrics & Gynecology

## 2019-05-14 DIAGNOSIS — Z1231 Encounter for screening mammogram for malignant neoplasm of breast: Secondary | ICD-10-CM

## 2020-01-21 NOTE — Progress Notes (Signed)
64 y.o. G10P2002 Married White or Caucasian female here for annual exam.  Has lost friends and family to [place].    Denies vaginal bleeding.    PCP:  Dr. Sharon Mt.  Has appt next month.  Will have lab work done a week early for lab work.  No LMP recorded. Patient has had a hysterectomy.          Sexually active: Yes.    The current method of family planning is status post hysterectomy.    Exercising: Yes.    walking Smoker:  no  Health Maintenance: Pap:  2000 neg History of abnormal Pap:  no MMG:  05-18-2019 category a density birads 1:neg Colonoscopy:  2018 f/u 89yrs BMD:   Never, declines TDaP:  2020 Pneumonia vaccine(s):  2019 Shingrix:   Not done Hep C testing: not done Screening Labs: will have this done this done next month with Dr. Sharon Mt.   reports that she has never smoked. She has never used smokeless tobacco. She reports that she does not drink alcohol and does not use drugs.  Past Medical History:  Diagnosis Date  . Galactosemia (HCC)    Carrier  . Hypertension   . Mild asthma     Past Surgical History:  Procedure Laterality Date  . CESAREAN SECTION  1988  . KNEE ARTHROSCOPY Left 01/03/2016   done by Dr. Sherlean Foot   . LAPAROSCOPIC CHOLECYSTECTOMY  02/2001  . TONSILLECTOMY AND ADENOIDECTOMY  Age 23  . TOTAL VAGINAL HYSTERECTOMY  02/1999   A+P repair, cystoscopy    Current Outpatient Medications  Medication Sig Dispense Refill  . ASMANEX 30 METERED DOSES 220 MCG/INH inhaler Inhale 1 puff into the lungs daily.     . chlorthalidone (HYGROTON) 25 MG tablet   5  . Cholecalciferol (VITAMIN D) 2000 UNITS tablet Take 2,000 Units by mouth daily.    Marland Kitchen diltiazem (CARDIZEM LA) 240 MG 24 hr tablet Take 240 mg by mouth daily.    . fexofenadine (ALLEGRA) 30 MG tablet Take 30 mg by mouth daily.    Marland Kitchen losartan (COZAAR) 100 MG tablet Take 100 mg by mouth.    . montelukast (SINGULAIR) 10 MG tablet Take 10 mg by mouth at bedtime.    Marland Kitchen nystatin cream (MYCOSTATIN) Apply 1  application topically 2 (two) times daily. Apply to affected area BID for up to 7 days. 30 g 0  . potassium chloride SA (K-DUR,KLOR-CON) 20 MEQ tablet   5  . Triamcinolone Acetonide (NASACORT AQ NA) Place into the nose.    Marland Kitchen EPINEPHrine HCl (ASTHMANEFRIN IN) Inhale into the lungs. (Patient not taking: Reported on 01/25/2020)     No current facility-administered medications for this visit.    Family History  Problem Relation Age of Onset  . Diabetes Father   . Hypertension Father   . Colon cancer Father   . Diabetes Mellitus II Maternal Grandmother   . Liver cancer Paternal Grandfather   . Hypertension Mother   . Heart disease Mother   . Heart disease Maternal Uncle   . Stroke Paternal Grandmother   . Hypertension Paternal Grandmother     Review of Systems  Constitutional: Negative.   HENT: Negative.   Eyes: Negative.   Respiratory: Negative.   Cardiovascular: Negative.   Gastrointestinal: Negative.   Endocrine: Negative.   Genitourinary: Negative.   Musculoskeletal: Negative.   Skin: Negative.   Allergic/Immunologic: Negative.   Neurological: Negative.   Hematological: Negative.   Psychiatric/Behavioral: Negative.     Exam:  BP 120/74   Pulse 68   Resp 16   Ht 5' 3.75" (1.619 m)   Wt 211 lb (95.7 kg)   BMI 36.50 kg/m   Height: 5' 3.75" (161.9 cm)  General appearance: alert, cooperative and appears stated age Head: Normocephalic, without obvious abnormality, atraumatic Neck: no adenopathy, supple, symmetrical, trachea midline and thyroid normal to inspection and palpation Lungs: clear to auscultation bilaterally Breasts: normal appearance, no masses or tenderness Heart: regular rate and rhythm Abdomen: soft, non-tender; bowel sounds normal; no masses,  no organomegaly Extremities: extremities normal, atraumatic, no cyanosis or edema Skin: Skin color, texture, turgor normal. No rashes or lesions Lymph nodes: Cervical, supraclavicular, and axillary nodes  normal. No abnormal inguinal nodes palpated Neurologic: Grossly normal   Pelvic: External genitalia:  no lesions              Urethra:  normal appearing urethra with no masses, tenderness or lesions              Bartholins and Skenes: normal                 Vagina: normal appearing vagina with normal color and discharge, no lesions              Cervix: absent              Pap taken: No. Bimanual Exam:  Uterus:  uterus absent              Adnexa: no mass, fullness, tenderness               Rectovaginal: Confirms               Anus:  normal sphincter tone, no lesions  Chaperone, Zenovia Jordan, CMA, was present for exam.  A:  Well Woman with normal exam H/o TVH, A&P repair in 2000 PMP, no HRT Asthma Hypertension family hx of colon cancer.  Doing colonoscopy every 5 years  P:   Mammogram guidelines reviewed.  Doing 3D MMG. pap smear not indicated Declines BMD Colonoscopy every 2018.  Due again 2023. Vaccines reviewed.  Not interested in shingrix vaccination at this point RF for nystatin cream bid prn itching.  #30g/1RF Return annually or prn

## 2020-01-25 ENCOUNTER — Ambulatory Visit (INDEPENDENT_AMBULATORY_CARE_PROVIDER_SITE_OTHER): Payer: Medicare Other | Admitting: Obstetrics & Gynecology

## 2020-01-25 ENCOUNTER — Other Ambulatory Visit: Payer: Self-pay

## 2020-01-25 ENCOUNTER — Encounter: Payer: Self-pay | Admitting: Obstetrics & Gynecology

## 2020-01-25 VITALS — BP 120/74 | HR 68 | Resp 16 | Ht 63.75 in | Wt 211.0 lb

## 2020-01-25 DIAGNOSIS — Z01419 Encounter for gynecological examination (general) (routine) without abnormal findings: Secondary | ICD-10-CM | POA: Diagnosis not present

## 2020-01-25 DIAGNOSIS — Z124 Encounter for screening for malignant neoplasm of cervix: Secondary | ICD-10-CM

## 2020-02-04 ENCOUNTER — Ambulatory Visit: Payer: BC Managed Care – PPO | Admitting: Obstetrics & Gynecology

## 2020-04-10 ENCOUNTER — Other Ambulatory Visit: Payer: Self-pay | Admitting: Obstetrics & Gynecology

## 2020-04-10 DIAGNOSIS — Z1231 Encounter for screening mammogram for malignant neoplasm of breast: Secondary | ICD-10-CM

## 2020-05-16 ENCOUNTER — Other Ambulatory Visit: Payer: Self-pay

## 2020-05-16 ENCOUNTER — Ambulatory Visit
Admission: RE | Admit: 2020-05-16 | Discharge: 2020-05-16 | Disposition: A | Discharge status: Home / Self Care | Payer: Medicare Other | Source: Ambulatory Visit | Attending: Obstetrics & Gynecology | Admitting: Obstetrics & Gynecology

## 2020-05-16 DIAGNOSIS — Z1231 Encounter for screening mammogram for malignant neoplasm of breast: Secondary | ICD-10-CM

## 2021-01-02 ENCOUNTER — Other Ambulatory Visit: Payer: Self-pay | Admitting: Internal Medicine

## 2021-01-02 ENCOUNTER — Other Ambulatory Visit (HOSPITAL_COMMUNITY): Payer: Self-pay | Admitting: Internal Medicine

## 2021-01-02 DIAGNOSIS — M79601 Pain in right arm: Secondary | ICD-10-CM

## 2021-01-03 ENCOUNTER — Ambulatory Visit (HOSPITAL_COMMUNITY)
Admission: RE | Admit: 2021-01-03 | Discharge: 2021-01-03 | Disposition: A | Discharge status: Home / Self Care | Payer: Medicare Other | Source: Ambulatory Visit | Attending: Internal Medicine | Admitting: Internal Medicine

## 2021-01-03 ENCOUNTER — Other Ambulatory Visit: Payer: Self-pay

## 2021-01-03 DIAGNOSIS — M79601 Pain in right arm: Secondary | ICD-10-CM | POA: Diagnosis present

## 2021-04-23 ENCOUNTER — Other Ambulatory Visit: Payer: Self-pay | Admitting: Obstetrics & Gynecology

## 2021-04-23 DIAGNOSIS — Z1231 Encounter for screening mammogram for malignant neoplasm of breast: Secondary | ICD-10-CM

## 2021-05-28 ENCOUNTER — Ambulatory Visit
Admission: RE | Admit: 2021-05-28 | Discharge: 2021-05-28 | Disposition: A | Discharge status: Home / Self Care | Payer: Medicare Other | Source: Ambulatory Visit | Attending: Obstetrics & Gynecology | Admitting: Obstetrics & Gynecology

## 2021-05-28 DIAGNOSIS — Z1231 Encounter for screening mammogram for malignant neoplasm of breast: Secondary | ICD-10-CM

## 2021-09-24 ENCOUNTER — Encounter (HOSPITAL_BASED_OUTPATIENT_CLINIC_OR_DEPARTMENT_OTHER): Payer: Self-pay | Admitting: Obstetrics & Gynecology

## 2021-09-24 ENCOUNTER — Ambulatory Visit (INDEPENDENT_AMBULATORY_CARE_PROVIDER_SITE_OTHER): Payer: Medicare Other | Admitting: Obstetrics & Gynecology

## 2021-09-24 VITALS — BP 115/60 | HR 71 | Ht 63.75 in | Wt 215.0 lb

## 2021-09-24 DIAGNOSIS — Z780 Asymptomatic menopausal state: Secondary | ICD-10-CM

## 2021-09-24 DIAGNOSIS — J45909 Unspecified asthma, uncomplicated: Secondary | ICD-10-CM | POA: Diagnosis not present

## 2021-09-24 DIAGNOSIS — Z01419 Encounter for gynecological examination (general) (routine) without abnormal findings: Secondary | ICD-10-CM | POA: Diagnosis not present

## 2021-09-24 DIAGNOSIS — I10 Essential (primary) hypertension: Secondary | ICD-10-CM | POA: Diagnosis not present

## 2021-09-24 DIAGNOSIS — K469 Unspecified abdominal hernia without obstruction or gangrene: Secondary | ICD-10-CM | POA: Diagnosis not present

## 2021-09-24 DIAGNOSIS — Z90710 Acquired absence of both cervix and uterus: Secondary | ICD-10-CM

## 2021-09-24 NOTE — Progress Notes (Signed)
66 y.o. G35P2002 Married White or Caucasian female here for breast and pelvic exam.  Doing well.  Denies vaginal bleeding.   ? ? ?No LMP recorded. Patient has had a hysterectomy.          ?Sexually active: Yes.    ?H/O STD:  no ? ?Health Maintenance: ?PCP:  Dr. Jiles Crocker.  Last wellness appt was 8/202.  Did blood work at that appt:  yes ?Vaccines are up to date:  has not done shingles vaccination ?Colonoscopy:  2018, due 03/2022.  Done at [facility] ?MMG:  05/28/2021 Negative ?BMD:  declines.  Would not take medication. ?Last pap smear:  not indicated.   ?H/o abnormal pap smear:  no ? ? ? reports that she has never smoked. She has never used smokeless tobacco. She reports that she does not drink alcohol and does not use drugs. ? ?Past Medical History:  ?Diagnosis Date  ? Galactosemia (HCC)   ? Carrier  ? Hypertension   ? Mild asthma   ? ? ?Past Surgical History:  ?Procedure Laterality Date  ? CESAREAN SECTION  1988  ? KNEE ARTHROSCOPY Left 01/03/2016  ? done by Dr. Sherlean Foot   ? LAPAROSCOPIC CHOLECYSTECTOMY  02/2001  ? TONSILLECTOMY AND ADENOIDECTOMY  Age 59  ? TOTAL VAGINAL HYSTERECTOMY  02/1999  ? A+P repair, cystoscopy  ? ? ?Current Outpatient Medications  ?Medication Sig Dispense Refill  ? ASMANEX 30 METERED DOSES 220 MCG/INH inhaler Inhale 1 puff into the lungs daily.     ? chlorthalidone (HYGROTON) 25 MG tablet   5  ? Cholecalciferol (VITAMIN D) 2000 UNITS tablet Take 2,000 Units by mouth daily.    ? diltiazem (CARDIZEM LA) 240 MG 24 hr tablet Take 240 mg by mouth daily.    ? EPINEPHrine HCl (ASTHMANEFRIN IN) Inhale into the lungs.    ? fexofenadine (ALLEGRA) 30 MG tablet Take 30 mg by mouth daily.    ? losartan (COZAAR) 100 MG tablet Take 100 mg by mouth.    ? nystatin cream (MYCOSTATIN) Apply 1 application topically 2 (two) times daily. Apply to affected area BID for up to 7 days. 30 g 0  ? potassium chloride SA (K-DUR,KLOR-CON) 20 MEQ tablet   5  ? Triamcinolone Acetonide (NASACORT AQ NA) Place into the nose.     ? montelukast (SINGULAIR) 10 MG tablet Take 10 mg by mouth at bedtime. (Patient not taking: Reported on 09/24/2021)    ? ?No current facility-administered medications for this visit.  ? ? ?Family History  ?Problem Relation Age of Onset  ? Diabetes Father   ? Hypertension Father   ? Colon cancer Father   ? Diabetes Mellitus II Maternal Grandmother   ? Liver cancer Paternal Grandfather   ? Hypertension Mother   ? Heart disease Mother   ? Heart disease Maternal Uncle   ? Stroke Paternal Grandmother   ? Hypertension Paternal Grandmother   ? ? ?Review of Systems  ?Constitutional: Negative.   ?Gastrointestinal: Negative.   ?Genitourinary: Negative.   ? ?Exam:   ?BP 115/60 (BP Location: Right Arm, Patient Position: Sitting, Cuff Size: Large)   Pulse 71   Ht 5' 3.75" (1.619 m) Comment: reported  Wt 215 lb (97.5 kg)   BMI 37.19 kg/m?   Height: 5' 3.75" (161.9 cm) (reported) ? ?General appearance: alert, cooperative and appears stated age ?Breasts: normal appearance, no masses or tenderness ?Abdomen: soft, non-tender; bowel sounds normal; no masses,  no organomegaly ?Lymph nodes: Cervical, supraclavicular, and axillary nodes normal.  No abnormal inguinal nodes palpated ?Neurologic: Grossly normal ? ?Pelvic: External genitalia:  no lesions ?             Urethra:  normal appearing urethra with no masses, tenderness or lesions ?             Bartholins and Skenes: normal    ?             Vagina: normal appearing vagina with atrophic changes and no discharge, no lesions, midline enterocele ?             Cervix: absent ?             Pap taken: No. ?Bimanual Exam:  Uterus:  uterus absent ?             Adnexa: no mass, fullness, tenderness ?              Rectovaginal: Confirms ?              Anus:  normal sphincter tone, no lesions ? ?Chaperone, Ina Homes, CMA, was present for exam. ? ?Assessment/Plan: ?1. Encntr for gyn exam (general) (routine) w/o abn findings ?- pap smear not indicated ?- MMG up to date ?- colonoscopy due  this October ?- declines BMD ?- lab work done with Dr. Link Snuffer ?- vaccines reviewed/updated ? ?2. Enterocele ? ?3. Postmenopausal ?- no HRT ? ?4. Asthma, unspecified asthma severity, unspecified whether complicated, unspecified whether persistent ? ?5. Primary hypertension ? ?6. H/O: hysterectomy ? ? ? ? ?

## 2022-05-08 ENCOUNTER — Other Ambulatory Visit: Payer: Self-pay | Admitting: Obstetrics & Gynecology

## 2022-05-08 DIAGNOSIS — Z1231 Encounter for screening mammogram for malignant neoplasm of breast: Secondary | ICD-10-CM

## 2022-06-20 ENCOUNTER — Ambulatory Visit
Admission: RE | Admit: 2022-06-20 | Discharge: 2022-06-20 | Disposition: A | Discharge status: Home / Self Care | Payer: Medicare Other | Source: Ambulatory Visit | Attending: Obstetrics & Gynecology | Admitting: Obstetrics & Gynecology

## 2022-06-20 DIAGNOSIS — Z1231 Encounter for screening mammogram for malignant neoplasm of breast: Secondary | ICD-10-CM

## 2022-06-26 ENCOUNTER — Other Ambulatory Visit: Payer: Self-pay | Admitting: Obstetrics & Gynecology

## 2022-06-26 DIAGNOSIS — R921 Mammographic calcification found on diagnostic imaging of breast: Secondary | ICD-10-CM

## 2022-06-27 ENCOUNTER — Ambulatory Visit
Admission: RE | Admit: 2022-06-27 | Discharge: 2022-06-27 | Disposition: A | Discharge status: Home / Self Care | Payer: Medicare Other | Source: Ambulatory Visit | Attending: Obstetrics & Gynecology | Admitting: Obstetrics & Gynecology

## 2022-06-27 DIAGNOSIS — R921 Mammographic calcification found on diagnostic imaging of breast: Secondary | ICD-10-CM

## 2022-06-28 ENCOUNTER — Other Ambulatory Visit: Payer: Self-pay | Admitting: Obstetrics & Gynecology

## 2022-06-28 DIAGNOSIS — R928 Other abnormal and inconclusive findings on diagnostic imaging of breast: Secondary | ICD-10-CM

## 2022-07-05 ENCOUNTER — Ambulatory Visit
Admission: RE | Admit: 2022-07-05 | Discharge: 2022-07-05 | Disposition: A | Discharge status: Home / Self Care | Payer: Medicare Other | Source: Ambulatory Visit | Attending: Obstetrics & Gynecology | Admitting: Obstetrics & Gynecology

## 2022-07-05 DIAGNOSIS — R928 Other abnormal and inconclusive findings on diagnostic imaging of breast: Secondary | ICD-10-CM

## 2022-07-05 HISTORY — PX: BREAST BIOPSY: SHX20

## 2022-07-30 ENCOUNTER — Other Ambulatory Visit: Payer: Self-pay | Admitting: Obstetrics & Gynecology

## 2022-07-30 DIAGNOSIS — R921 Mammographic calcification found on diagnostic imaging of breast: Secondary | ICD-10-CM

## 2023-01-06 ENCOUNTER — Ambulatory Visit
Admission: RE | Admit: 2023-01-06 | Discharge: 2023-01-06 | Disposition: A | Discharge status: Home / Self Care | Payer: Medicare Other | Source: Ambulatory Visit | Attending: Obstetrics & Gynecology | Admitting: Obstetrics & Gynecology

## 2023-01-06 DIAGNOSIS — R921 Mammographic calcification found on diagnostic imaging of breast: Secondary | ICD-10-CM

## 2023-01-14 ENCOUNTER — Other Ambulatory Visit: Payer: Self-pay | Admitting: Internal Medicine

## 2023-01-14 DIAGNOSIS — Z1231 Encounter for screening mammogram for malignant neoplasm of breast: Secondary | ICD-10-CM

## 2023-06-23 ENCOUNTER — Ambulatory Visit
Admission: RE | Admit: 2023-06-23 | Discharge: 2023-06-23 | Disposition: A | Discharge status: Home / Self Care | Payer: Medicare Other | Source: Ambulatory Visit | Attending: Internal Medicine | Admitting: Internal Medicine

## 2023-06-23 DIAGNOSIS — Z1231 Encounter for screening mammogram for malignant neoplasm of breast: Secondary | ICD-10-CM

## 2023-07-26 IMAGING — MG MM DIGITAL SCREENING BILAT W/ TOMO AND CAD
6 of 12 series · 6 of 36 positions shown · non-contrast
Comparison: Previous exam(s).

CLINICAL DATA: Screening.

EXAM:
DIGITAL SCREENING BILATERAL MAMMOGRAM WITH TOMOSYNTHESIS AND CAD
TECHNIQUE: Bilateral screening digital craniocaudal and mediolateral oblique
mammograms were obtained. Bilateral screening digital breast
tomosynthesis was performed. The images were evaluated with
computer-aided detection.

[L MLO synth-2D]
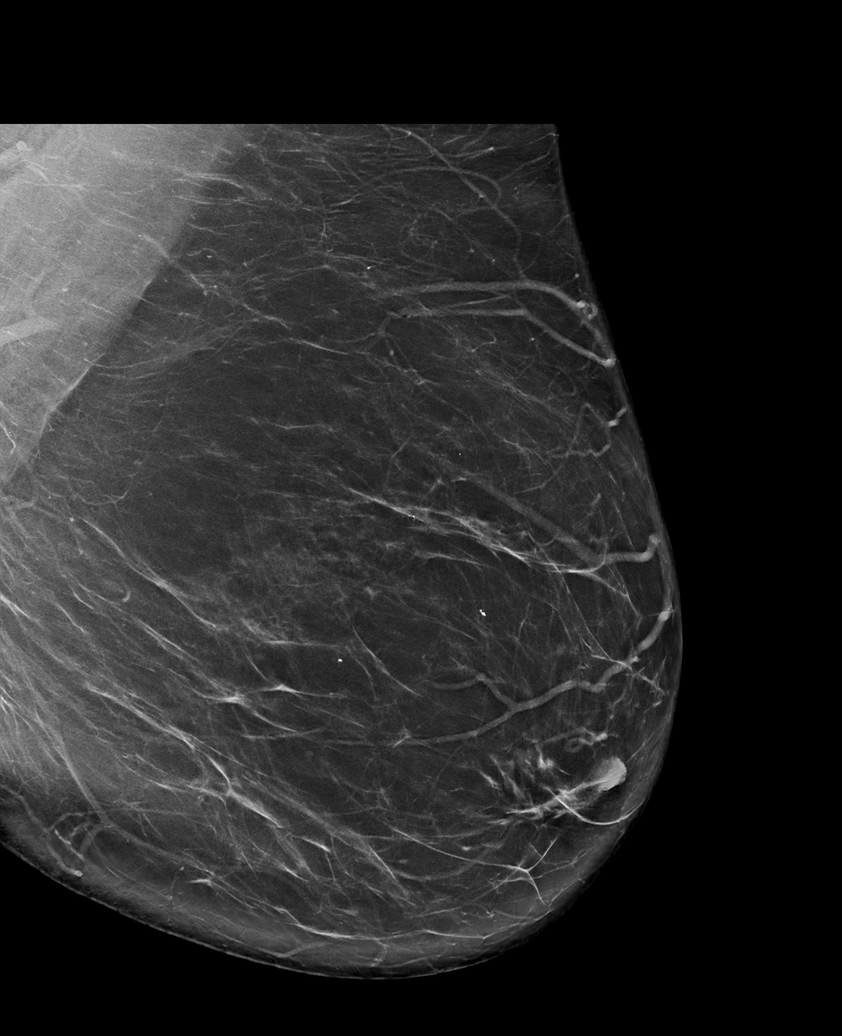

[R MLO synth-2D (1 of 2)]
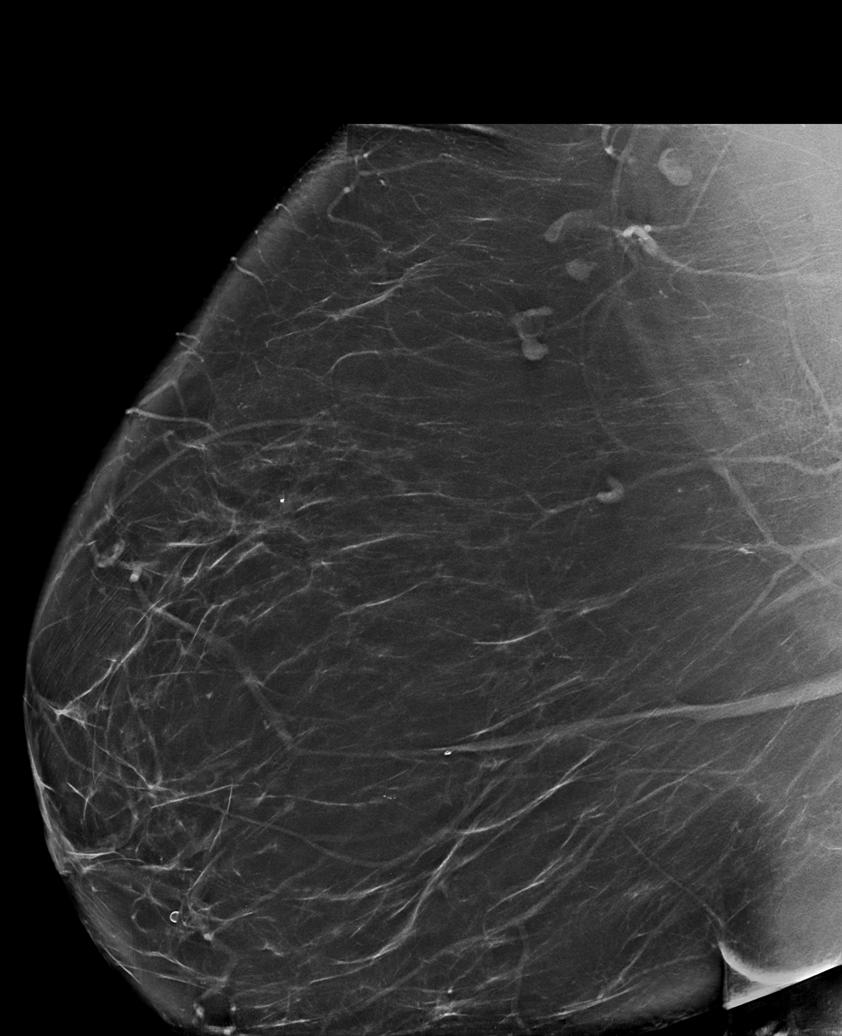

[R CC synth-2D]
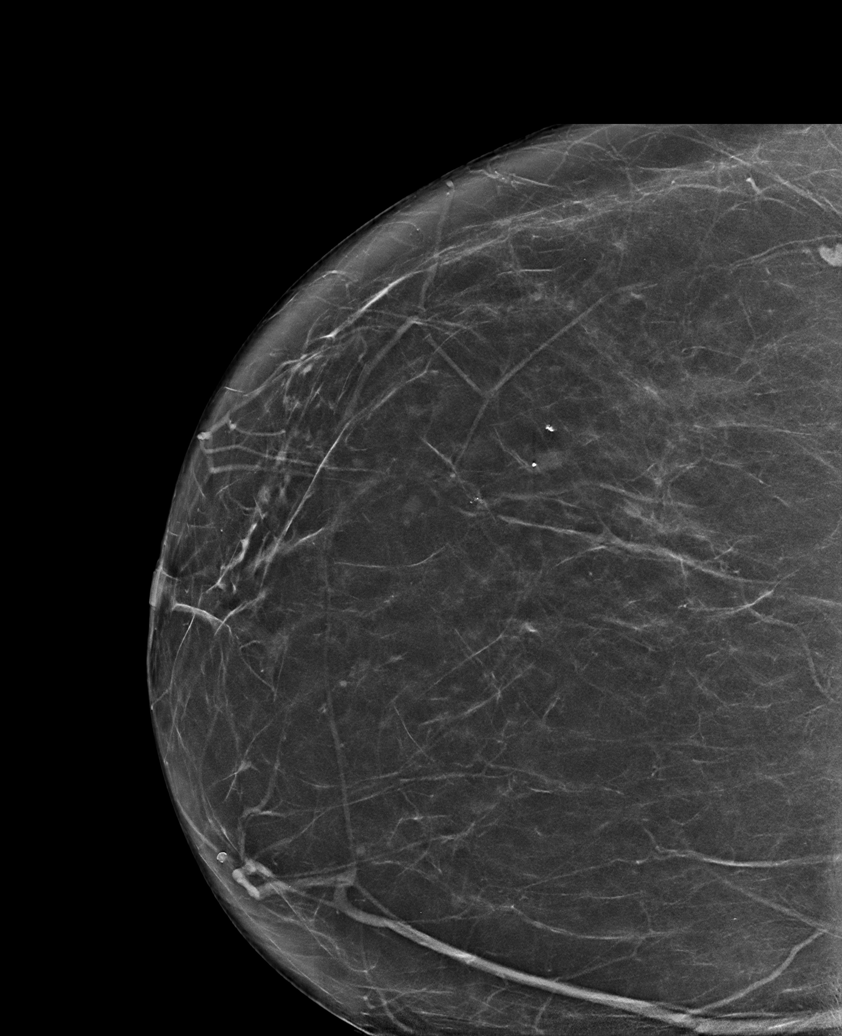

[L CC synth-2D]
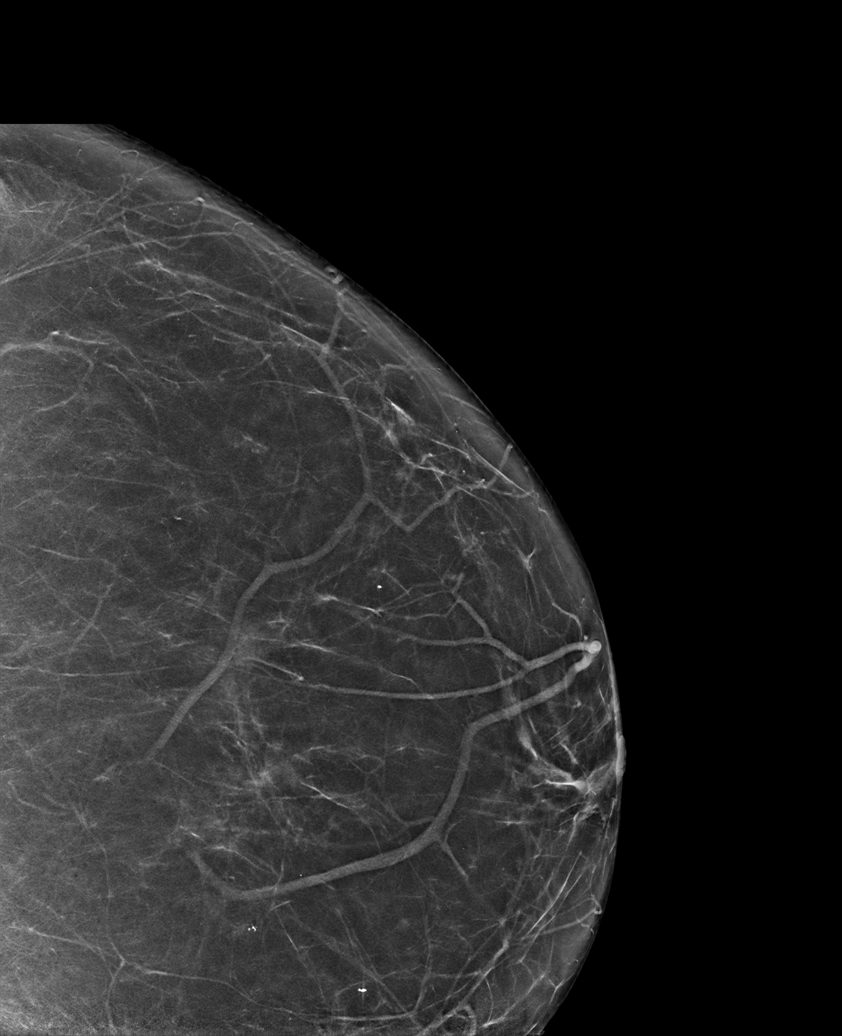

[L CV synth-2D]
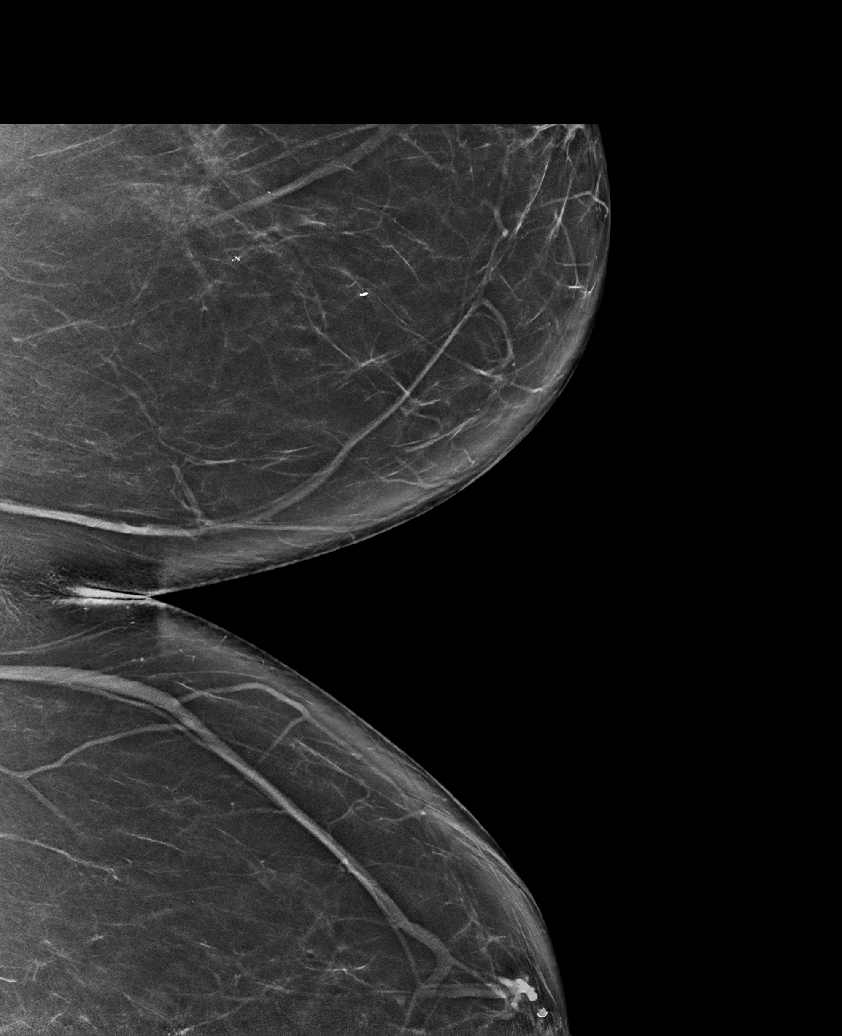

[R MLO synth-2D (2 of 2)]
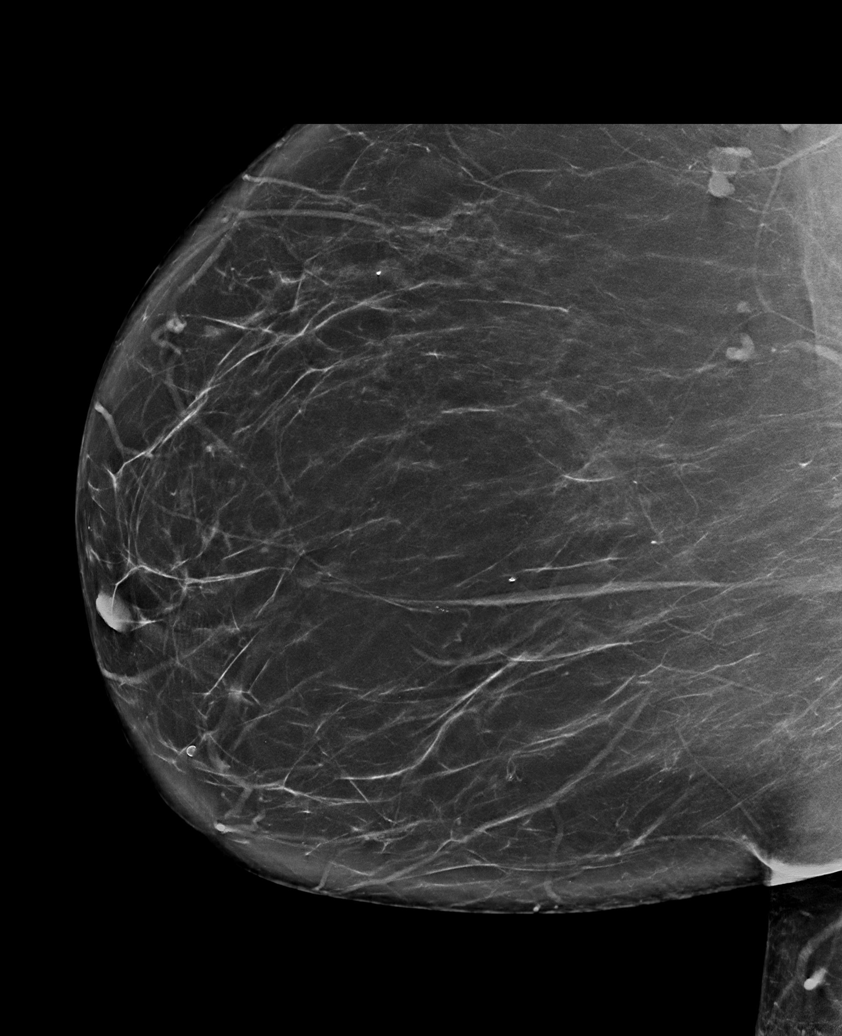

[6 of 36 positions shown; findings below may reference images not displayed]

ACR Breast Density Category b: There are scattered areas of
fibroglandular density.
FINDINGS: There are no findings suspicious for malignancy.
IMPRESSION: No mammographic evidence of malignancy. A result letter of this
screening mammogram will be mailed directly to the patient.

RECOMMENDATION:
Screening mammogram in one year. (Code:51-O-LD2)

BI-RADS CATEGORY  1: Negative.

## 2024-04-26 ENCOUNTER — Ambulatory Visit (HOSPITAL_BASED_OUTPATIENT_CLINIC_OR_DEPARTMENT_OTHER): Admitting: Obstetrics & Gynecology

## 2024-05-27 ENCOUNTER — Other Ambulatory Visit: Payer: Self-pay | Admitting: Internal Medicine

## 2024-05-27 DIAGNOSIS — Z1231 Encounter for screening mammogram for malignant neoplasm of breast: Secondary | ICD-10-CM

## 2024-06-28 ENCOUNTER — Ambulatory Visit
Admission: RE | Admit: 2024-06-28 | Discharge: 2024-06-28 | Disposition: A | Discharge status: Home / Self Care | Source: Ambulatory Visit | Attending: Internal Medicine | Admitting: Internal Medicine

## 2024-06-28 DIAGNOSIS — Z1231 Encounter for screening mammogram for malignant neoplasm of breast: Secondary | ICD-10-CM

## 2024-08-26 ENCOUNTER — Ambulatory Visit (HOSPITAL_BASED_OUTPATIENT_CLINIC_OR_DEPARTMENT_OTHER): Admitting: Obstetrics & Gynecology
